# Patient Record
Sex: Female | Born: 2000
Health system: Southern US, Community
[De-identification: ages and names within clinical notes are randomized; demographics above are authoritative.]

## PROBLEM LIST (undated history)

## (undated) ENCOUNTER — Emergency Department (HOSPITAL_BASED_OUTPATIENT_CLINIC_OR_DEPARTMENT_OTHER): Admission: EM | Payer: Self-pay

---
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
## 2022-01-02 ENCOUNTER — Emergency Department (HOSPITAL_BASED_OUTPATIENT_CLINIC_OR_DEPARTMENT_OTHER)
Admission: EM | Admit: 2022-01-02 | Discharge: 2022-01-02 | Disposition: A | Discharge status: Home / Self Care | Payer: BC Managed Care – PPO | Attending: Emergency Medicine | Admitting: Emergency Medicine

## 2022-01-02 ENCOUNTER — Emergency Department (HOSPITAL_BASED_OUTPATIENT_CLINIC_OR_DEPARTMENT_OTHER): Payer: BC Managed Care – PPO

## 2022-01-02 ENCOUNTER — Other Ambulatory Visit: Payer: Self-pay

## 2022-01-02 ENCOUNTER — Encounter (HOSPITAL_BASED_OUTPATIENT_CLINIC_OR_DEPARTMENT_OTHER): Payer: Self-pay | Admitting: Emergency Medicine

## 2022-01-02 DIAGNOSIS — S99912A Unspecified injury of left ankle, initial encounter: Secondary | ICD-10-CM | POA: Diagnosis present

## 2022-01-02 DIAGNOSIS — S93492A Sprain of other ligament of left ankle, initial encounter: Secondary | ICD-10-CM

## 2022-01-02 DIAGNOSIS — W172XXA Fall into hole, initial encounter: Secondary | ICD-10-CM | POA: Insufficient documentation

## 2022-01-02 NOTE — ED Provider Notes (Signed)
[place] EMERGENCY DEPARTMENT Provider Note   CSN: [id number] Arrival date & time: 01/02/22  1115     History  Chief Complaint  Patient presents with   Ankle Pain    Tina Hutchinson is a 21 y.o. female.  21 yo F with a chief complaint of a fall.  Patient states that she was walking and stepped intermittently into a ditch and felt 3 cracks.  Since then she has been able to ambulate but with some discomfort.  Denies any pain to the knee.  Denies any pain to the foot.  Denies any other injury in the fall.   Ankle Pain      Home Medications Prior to Admission medications   Medication Sig Start Date End Date Taking? Authorizing Provider  cyclobenzaprine (FLEXERIL) 10 MG tablet Take 1 tablet (10 mg total) by mouth 2 (two) times daily as needed for muscle spasms. 02/12/19   Corena Herter, PA-C  ibuprofen (ADVIL) 400 MG tablet Take 1 tablet (400 mg total) by mouth every 6 (six) hours as needed. 02/12/19   Corena Herter, PA-C      Allergies    Patient has no known allergies.    Review of Systems   Review of Systems  Physical Exam Updated Vital Signs BP 133/80   Pulse 76   Temp 97.8 F (36.6 C) (Oral)   Resp 16   LMP 01/02/2022   SpO2 100%  Physical Exam Vitals and nursing note reviewed.  Constitutional:      General: She is not in acute distress.    Appearance: She is well-developed. She is not diaphoretic.  HENT:     Head: Normocephalic and atraumatic.  Eyes:     Pupils: Pupils are equal, round, and reactive to light.  Cardiovascular:     Rate and Rhythm: Normal rate and regular rhythm.     Heart sounds: No murmur heard.    No friction rub. No gallop.  Pulmonary:     Effort: Pulmonary effort is normal.     Breath sounds: No wheezing or rales.  Abdominal:     General: There is no distension.     Palpations: Abdomen is soft.     Tenderness: There is no abdominal tenderness.  Musculoskeletal:        General: No tenderness.     Cervical  back: Normal range of motion and neck supple.     Comments: Pain and swelling mostly to the lateral malleolus but also some pain medially.  No pain at the base of the fifth metatarsal or the navicular.  Pulse motor and sensation intact distally.  No pain at the fibular neck  Skin:    General: Skin is warm and dry.  Neurological:     Mental Status: She is alert and oriented to person, place, and time.  Psychiatric:        Behavior: Behavior normal.     ED Results / Procedures / Treatments   Labs (all labs ordered are listed, but only abnormal results are displayed) Labs Reviewed - No data to display  EKG None  Radiology DG Ankle Complete Left  Result Date: 01/02/2022 CLINICAL DATA:  Pain and swelling, recent trauma EXAM: LEFT ANKLE COMPLETE - 3+ VIEW COMPARISON:  None Available. FINDINGS: No fracture or dislocation is seen. There is soft tissue swelling around the ankle, more so over the lateral malleolus. IMPRESSION: No fracture or dislocation is seen in the left ankle. Electronically Signed   By: Royston Cowper  Rathinasamy M.D.   On: 01/02/2022 11:47    Procedures Procedures    Medications Ordered in ED Medications - No data to display  ED Course/ Medical Decision Making/ A&P                           Medical Decision Making Amount and/or Complexity of Data Reviewed Radiology: ordered.   21 yo F with a chief complaint of a fall.  Patient had stepped into a ditch couple days ago.  Having pain and swelling to the left ankle.  Obtain a plain film to assess for fracture.  Reassess.  Plain film of the ankle independently turbid by me without fracture.  ASO crutches sports medicine follow-up.  11:53 AM:  I have discussed the diagnosis/risks/treatment options with the patient.  Evaluation and diagnostic testing in the emergency department does not suggest an emergent condition requiring admission or immediate intervention beyond what has been performed at this time.  They will follow up  with  PCP, sports med. We also discussed returning to the ED immediately if new or worsening sx occur. We discussed the sx which are most concerning (e.g., sudden worsening pain, fever, inability to tolerate by mouth) that necessitate immediate return. Medications administered to the patient during their visit and any new prescriptions provided to the patient are listed below.  Medications given during this visit Medications - No data to display   The patient appears reasonably screen and/or stabilized for discharge and I doubt any other medical condition or other [place] requiring further screening, evaluation, or treatment in the ED at this time prior to discharge.          Final Clinical Impression(s) / ED Diagnoses Final diagnoses:  Sprain of anterior talofibular ligament of left ankle, initial encounter    Rx / DC Orders ED Discharge Orders     None         Deno Etienne, DO 01/02/22 1153

## 2022-01-02 NOTE — ED Triage Notes (Signed)
Patient presents to ED via POV from home. Here with left ankle pain after falling in a ditch on Saturday. Ambulatory with limping gait. Denies LOC or hitting head.

## 2022-01-02 NOTE — Discharge Instructions (Addendum)
Tylenol and motrin for pain.  Follow up with your family doctor in the office.  Try to keep your weight off of your foot as best you can for the next week.
